# Patient Record
Sex: Male | Born: 1990 | Race: White | Marital: Single | Smoking: Former smoker
Health system: Southern US, Community
[De-identification: ages and names within clinical notes are randomized; demographics above are authoritative.]

## PROBLEM LIST (undated history)

## (undated) DIAGNOSIS — F419 Anxiety disorder, unspecified: Secondary | ICD-10-CM

## (undated) DIAGNOSIS — F32A Depression, unspecified: Secondary | ICD-10-CM

## (undated) DIAGNOSIS — F329 Major depressive disorder, single episode, unspecified: Secondary | ICD-10-CM

---

## 2017-10-29 ENCOUNTER — Other Ambulatory Visit: Payer: Self-pay | Admitting: Otolaryngology

## 2017-10-29 DIAGNOSIS — R590 Localized enlarged lymph nodes: Secondary | ICD-10-CM

## 2017-10-29 DIAGNOSIS — J358 Other chronic diseases of tonsils and adenoids: Secondary | ICD-10-CM

## 2017-11-02 ENCOUNTER — Ambulatory Visit
Admission: RE | Admit: 2017-11-02 | Discharge: 2017-11-02 | Disposition: A | Discharge status: Home / Self Care | Payer: BLUE CROSS/BLUE SHIELD | Source: Ambulatory Visit | Attending: Otolaryngology | Admitting: Otolaryngology

## 2017-11-02 DIAGNOSIS — R590 Localized enlarged lymph nodes: Secondary | ICD-10-CM

## 2017-11-02 DIAGNOSIS — J358 Other chronic diseases of tonsils and adenoids: Secondary | ICD-10-CM

## 2017-11-02 MED ORDER — IOPAMIDOL (ISOVUE-300) INJECTION 61%
75.0000 mL | Freq: Once | INTRAVENOUS | Status: AC | PRN
  Administered 2017-11-02: 75 mL via INTRAVENOUS

## 2017-11-06 ENCOUNTER — Other Ambulatory Visit: Payer: Self-pay | Admitting: Otolaryngology

## 2017-11-06 DIAGNOSIS — R590 Localized enlarged lymph nodes: Secondary | ICD-10-CM

## 2017-11-13 ENCOUNTER — Other Ambulatory Visit (HOSPITAL_COMMUNITY): Payer: Self-pay | Admitting: Otolaryngology

## 2017-11-13 DIAGNOSIS — R590 Localized enlarged lymph nodes: Secondary | ICD-10-CM

## 2018-02-08 ENCOUNTER — Inpatient Hospital Stay (HOSPITAL_COMMUNITY)
Admission: EM | Admit: 2018-02-08 | Discharge: 2018-02-10 | DRG: 373 | Disposition: A | Discharge status: Home / Self Care | Payer: BLUE CROSS/BLUE SHIELD | Attending: Surgery | Admitting: Surgery

## 2018-02-08 ENCOUNTER — Encounter (HOSPITAL_COMMUNITY): Payer: Self-pay | Admitting: *Deleted

## 2018-02-08 ENCOUNTER — Emergency Department (HOSPITAL_COMMUNITY): Payer: BLUE CROSS/BLUE SHIELD

## 2018-02-08 ENCOUNTER — Other Ambulatory Visit: Payer: Self-pay

## 2018-02-08 DIAGNOSIS — F419 Anxiety disorder, unspecified: Secondary | ICD-10-CM | POA: Diagnosis present

## 2018-02-08 DIAGNOSIS — R1031 Right lower quadrant pain: Secondary | ICD-10-CM

## 2018-02-08 DIAGNOSIS — Z79899 Other long term (current) drug therapy: Secondary | ICD-10-CM

## 2018-02-08 DIAGNOSIS — Z87891 Personal history of nicotine dependence: Secondary | ICD-10-CM

## 2018-02-08 DIAGNOSIS — F329 Major depressive disorder, single episode, unspecified: Secondary | ICD-10-CM | POA: Diagnosis present

## 2018-02-08 DIAGNOSIS — K3533 Acute appendicitis with perforation and localized peritonitis, with abscess: Principal | ICD-10-CM | POA: Diagnosis present

## 2018-02-08 DIAGNOSIS — K3580 Unspecified acute appendicitis: Secondary | ICD-10-CM | POA: Diagnosis present

## 2018-02-08 HISTORY — DX: Depression, unspecified: F32.A

## 2018-02-08 HISTORY — DX: Major depressive disorder, single episode, unspecified: F32.9

## 2018-02-08 HISTORY — DX: Anxiety disorder, unspecified: F41.9

## 2018-02-08 LAB — COMPREHENSIVE METABOLIC PANEL
ALBUMIN: 4.5 g/dL (ref 3.5–5.0)
ALT: 36 U/L (ref 17–63)
AST: 26 U/L (ref 15–41)
Alkaline Phosphatase: 63 U/L (ref 38–126)
Anion gap: 12 (ref 5–15)
BILIRUBIN TOTAL: 0.5 mg/dL (ref 0.3–1.2)
BUN: 25 mg/dL — AB (ref 6–20)
CO2: 26 mmol/L (ref 22–32)
CREATININE: 1.22 mg/dL (ref 0.61–1.24)
Calcium: 9.9 mg/dL (ref 8.9–10.3)
Chloride: 99 mmol/L — ABNORMAL LOW (ref 101–111)
GFR calc Af Amer: >60 mL/min (ref >60)
GLUCOSE: 106 mg/dL — AB (ref 65–99)
Potassium: 4.2 mmol/L (ref 3.5–5.1)
Sodium: 137 mmol/L (ref 135–145)
TOTAL PROTEIN: 8 g/dL (ref 6.5–8.1)

## 2018-02-08 LAB — CBC
HEMATOCRIT: 46.7 % (ref 39.0–52.0)
Hemoglobin: 15.7 g/dL (ref 13.0–17.0)
MCH: 29.8 pg (ref 26.0–34.0)
MCHC: 33.6 g/dL (ref 30.0–36.0)
MCV: 88.6 fL (ref 78.0–100.0)
PLATELETS: 288 10*3/uL (ref 150–400)
RBC: 5.27 MIL/uL (ref 4.22–5.81)
RDW: 13.5 % (ref 11.5–15.5)
WBC: 11.6 10*3/uL — AB (ref 4.0–10.5)

## 2018-02-08 LAB — URINALYSIS, ROUTINE W REFLEX MICROSCOPIC
BILIRUBIN URINE: NEGATIVE
GLUCOSE, UA: NEGATIVE mg/dL
Hgb urine dipstick: NEGATIVE
KETONES UR: NEGATIVE mg/dL
LEUKOCYTES UA: NEGATIVE
NITRITE: NEGATIVE
PROTEIN: NEGATIVE mg/dL
Specific Gravity, Urine: 1.017 (ref 1.005–1.030)
pH: 6 (ref 5.0–8.0)

## 2018-02-08 LAB — GENTAMICIN LEVEL, RANDOM: Gentamicin Rm: 2 ug/mL

## 2018-02-08 LAB — LIPASE, BLOOD: Lipase: 26 U/L (ref 11–51)

## 2018-02-08 MED ORDER — IOPAMIDOL (ISOVUE-300) INJECTION 61%
INTRAVENOUS | Status: AC
Start: 2018-02-08 — End: 2018-02-08
  Filled 2018-02-08: qty 100

## 2018-02-08 MED ORDER — GENTAMICIN SULFATE 40 MG/ML IJ SOLN
520.0000 mg | INTRAVENOUS | Status: DC
  Administered 2018-02-08 – 2018-02-10 (×3): 520 mg via INTRAVENOUS
  Filled 2018-02-08 (×4): qty 13

## 2018-02-08 MED ORDER — IOPAMIDOL (ISOVUE-300) INJECTION 61%
100.0000 mL | Freq: Once | INTRAVENOUS | Status: AC | PRN
  Administered 2018-02-08: 100 mL via INTRAVENOUS

## 2018-02-08 MED ORDER — LAMOTRIGINE 100 MG PO TABS
150.0000 mg | ORAL_TABLET | Freq: Every day | ORAL | Status: DC
  Administered 2018-02-10: 150 mg via ORAL
  Filled 2018-02-08 (×2): qty 1

## 2018-02-08 MED ORDER — ACETAMINOPHEN 325 MG PO TABS: 650.0000 mg | ORAL_TABLET | Freq: Four times a day (QID) | ORAL | Status: DC | PRN

## 2018-02-08 MED ORDER — ACETAMINOPHEN 650 MG RE SUPP: 650.0000 mg | Freq: Four times a day (QID) | RECTAL | Status: DC | PRN

## 2018-02-08 MED ORDER — DIPHENHYDRAMINE HCL 25 MG PO CAPS: 25.0000 mg | ORAL_CAPSULE | Freq: Four times a day (QID) | ORAL | Status: DC | PRN

## 2018-02-08 MED ORDER — CIPROFLOXACIN IN D5W 400 MG/200ML IV SOLN
400.0000 mg | Freq: Two times a day (BID) | INTRAVENOUS | Status: DC
  Administered 2018-02-08 – 2018-02-10 (×5): 400 mg via INTRAVENOUS
  Filled 2018-02-08 (×5): qty 200

## 2018-02-08 MED ORDER — METRONIDAZOLE IN NACL 5-0.79 MG/ML-% IV SOLN
500.0000 mg | Freq: Three times a day (TID) | INTRAVENOUS | Status: DC
  Administered 2018-02-08 – 2018-02-10 (×7): 500 mg via INTRAVENOUS
  Filled 2018-02-08 (×7): qty 100

## 2018-02-08 MED ORDER — MORPHINE SULFATE (PF) 2 MG/ML IV SOLN: 2.0000 mg | INTRAVENOUS | Status: DC | PRN

## 2018-02-08 MED ORDER — KCL-LACTATED RINGERS-D5W 20 MEQ/L IV SOLN
INTRAVENOUS | Status: DC
  Administered 2018-02-08 – 2018-02-10 (×5): via INTRAVENOUS
  Filled 2018-02-08 (×7): qty 1000

## 2018-02-08 MED ORDER — DIPHENHYDRAMINE HCL 50 MG/ML IJ SOLN: 25.0000 mg | Freq: Four times a day (QID) | INTRAMUSCULAR | Status: DC | PRN

## 2018-02-08 NOTE — Progress Notes (Signed)
Pharmacy Antibiotic Note  Samuel Ashley is a 27 y.o. male admitted on 02/08/2018 with appendicitis with phlegmon/abscess.  Pharmacy has been consulted for gentamicin dosing, MD dosing cipro/flagyl due to hx PCN allergy.  Plan: Gentamicin 520mg  (~7mg /kg) IV q24h Check 10hr random gentamicin level tonight Follow renal function closely Cipro/Flagyl per MD  Height: 6\' 1"  (185.4 cm) Weight: 180 lb (81.6 kg) IBW/kg (Calculated) : 79.9  Temp (24hrs), Avg:98.2 F (36.8 C), Min:98.2 F (36.8 C), Max:98.2 F (36.8 C)  Recent Labs  Lab 02/08/18 0157  WBC 11.6*  CREATININE 1.22    Estimated Creatinine Clearance: 103.7 mL/min (by C-G formula based on SCr of 1.22 mg/dL).    Allergies  Allergen Reactions  . Bupropion Hives  . Penicillins Rash and Swelling  . Escitalopram Oxalate Other (See Comments)    Memory loss  . Fluticasone Other (See Comments)    epistaxis   . Melatonin Other (See Comments)    nightmares   . Montelukast Other (See Comments)    aggressive   . Sertraline Other (See Comments)    depression     Antimicrobials this admission: 4/22 Gentamicin >> 4/22 Cipro >> 4/22 Flagyl >>  Dose adjustments this admission: 4/22 gentamicin level 10hr post-dose = ___  Microbiology results: None  Thank you for allowing pharmacy to be a part of this patient's care.  Loralee PacasErin Garland Smouse, PharmD, BCPS Pager: [phone] 02/08/2018 10:17 AM

## 2018-02-08 NOTE — ED Notes (Signed)
Patient transported to CT 

## 2018-02-08 NOTE — ED Notes (Signed)
No answer when called to triage.

## 2018-02-08 NOTE — ED Notes (Signed)
ED TO INPATIENT HANDOFF REPORT  Name/Age/Gender Samuel Ashley 27 y.o. male  Code Status    Code Status Orders  (From admission, onward)        Start     Ordered   02/08/18 0911  Full code  Continuous     02/08/18 0914    Code Status History    This patient has a current code status but no historical code status.      Home/SNF/Other Home  Chief Complaint ABD PAIN  Level of Care/Admitting Diagnosis ED Disposition    ED Disposition Condition [facility]: [facility] [100102]  Level of Care: Med-Surg [16]  Diagnosis: Acute appendicitis [149702]  Admitting Physician: CCS, [place]  Attending Physician: CCS, MD [[id number]  PT Class (Do Not Modify): Observation [104]  PT Acc Code (Do Not Modify): Observation [10022]       Medical History Past Medical History:  Diagnosis Date  . Anxiety   . Depression     Allergies Allergies  Allergen Reactions  . Bupropion Hives  . Penicillins Rash and Swelling  . Escitalopram Oxalate Other (See Comments)    Memory loss  . Fluticasone Other (See Comments)    epistaxis   . Melatonin Other (See Comments)    nightmares   . Montelukast Other (See Comments)    aggressive   . Sertraline Other (See Comments)    depression     IV Location/Drains/Wounds Patient Lines/Drains/Airways Status   Active Line/Drains/Airways    Name:   Placement date:   Placement time:   Site:   Days:   Peripheral IV 02/08/18 Left Forearm   02/08/18    0624    Forearm   less than 1          Labs/Imaging Results for orders placed or performed during the hospital encounter of 02/08/18 (from the past 48 hour(s))  Lipase, blood     Status: None   Collection Time: 02/08/18  1:57 AM  Result Value Ref Range   Lipase 26 11 - 51 U/L    Comment: Performed at [facility], [place] [address] [place], [place] [address]  Comprehensive metabolic panel     Status: Abnormal   Collection Time:  02/08/18  1:57 AM  Result Value Ref Range   Sodium 137 135 - 145 mmol/L   Potassium 4.2 3.5 - 5.1 mmol/L   Chloride 99 (L) 101 - 111 mmol/L   CO2 26 22 - 32 mmol/L   Glucose, Bld 106 (H) 65 - 99 mg/dL   BUN 25 (H) 6 - 20 mg/dL   Creatinine, Ser 1.22 0.61 - 1.24 mg/dL   Calcium 9.9 8.9 - 10.3 mg/dL   Total Protein 8.0 6.5 - 8.1 g/dL   Albumin 4.5 3.5 - 5.0 g/dL   AST 26 15 - 41 U/L   ALT 36 17 - 63 U/L   Alkaline Phosphatase 63 38 - 126 U/L   Total Bilirubin 0.5 0.3 - 1.2 mg/dL   GFR calc non Af Amer >60 >60 mL/min   GFR calc Af Amer >60 >60 mL/min    Comment: (NOTE) The eGFR has been calculated using the CKD EPI equation. This calculation has not been validated in all clinical situations. eGFR's persistently <60 mL/min signify possible Chronic Kidney Disease.    Anion gap 12 5 - 15    Comment: Performed at [facility], [place] [address] [place], [place] [address]  CBC  Status: Abnormal   Collection Time: 02/08/18  1:57 AM  Result Value Ref Range   WBC 11.6 (H) 4.0 - 10.5 K/uL   RBC 5.27 4.22 - 5.81 MIL/uL   Hemoglobin 15.7 13.0 - 17.0 g/dL   HCT 46.7 39.0 - 52.0 %   MCV 88.6 78.0 - 100.0 fL   MCH 29.8 26.0 - 34.0 pg   MCHC 33.6 30.0 - 36.0 g/dL   RDW 13.5 11.5 - 15.5 %   Platelets 288 150 - 400 K/uL    Comment: Performed at [facility], [place] [address] [place], [place] [address]  Urinalysis, Routine w reflex microscopic     Status: None   Collection Time: 02/08/18  1:57 AM  Result Value Ref Range   Color, Urine YELLOW YELLOW   APPearance CLEAR CLEAR   Specific Gravity, Urine 1.017 1.005 - 1.030   pH 6.0 5.0 - 8.0   Glucose, UA NEGATIVE NEGATIVE mg/dL   Hgb urine dipstick NEGATIVE NEGATIVE   Bilirubin Urine NEGATIVE NEGATIVE   Ketones, ur NEGATIVE NEGATIVE mg/dL   Protein, ur NEGATIVE NEGATIVE mg/dL   Nitrite NEGATIVE NEGATIVE   Leukocytes, UA NEGATIVE NEGATIVE    Comment: Performed at [facility]  [address], [place], [place] [address]   Ct Abdomen Pelvis W Contrast  Result Date: 02/08/2018 CLINICAL DATA:  27 year old with stabbing right lower quadrant and right groin pain with fever overnight. Possible injury at the gym. EXAM: CT ABDOMEN AND PELVIS WITH CONTRAST TECHNIQUE: Multidetector CT imaging of the abdomen and pelvis was performed using the standard protocol following bolus administration of intravenous contrast. CONTRAST:  145m ISOVUE-300 IOPAMIDOL (ISOVUE-300) INJECTION 61% COMPARISON:  None. FINDINGS: Lower chest: Clear lung bases. No significant pleural or pericardial effusion. Hepatobiliary: The liver is normal in density without focal abnormality. No evidence of gallstones, gallbladder wall thickening or biliary dilatation. Pancreas: Unremarkable. No pancreatic ductal dilatation or surrounding inflammatory changes. Spleen: Normal in size without focal abnormality. Adrenals/Urinary Tract: Both adrenal glands appear normal. The kidneys appear normal without evidence of urinary tract calculus, suspicious lesion or hydronephrosis. No bladder abnormalities are seen. Stomach/Bowel: Bowel assessment limited by the lack enteric contrast. The stomach and small bowel appear unremarkable. There is a retrocecal inflammatory process with a probable phlegmon measuring 3.7 x 3.1 cm on image 51/2. This measures up to 4.0 cm cephalocaudad on coronal image 44. This is likely associated with the appendiceal tip, best seen on the reformatted images. The appendix measures up to 8 mm in diameter. Appearance is suspicious for atypical acute appendicitis with a periappendiceal phlegmon/abscess. There is no cecal wall thickening or significant distention. Prominent stool is present throughout the colon. Vascular/Lymphatic: There are several prominent lymph nodes within the ileocolonic mesentery which are probably reactive. No significant vascular findings. Reproductive: The prostate gland and seminal vesicles appear  normal. Other: There is no free air or ascites. No other extraluminal fluid collections are seen. Musculoskeletal: No acute or significant osseous findings. IMPRESSION: 1. Retrocecal inflammatory process, likely associated with the appendiceal tip, suspicious for atypical appendicitis with a periappendiceal phlegmon/abscess. Appendix: Location: Retrocecal Diameter: 8 mm Appendicolith: No. Mucosal hyper-enhancement: No. Extraluminal gas: No Periappendiceal collection: Ill-defined collection measuring up to 4 cm suspicious for phlegmon/abscess. 2. No evidence of bowel obstruction, free air or ascites. 3. Probable reactive adenopathy in the right lower quadrant. 4. These results were called by telephone at the time of interpretation on 02/08/2018 at 7:37 am to [facility] who verbally acknowledged  these results. Electronically Signed   By: Richardean Sale M.D.   On: 02/08/2018 07:39    Pending Labs Unresulted Labs (From admission, onward)   Start     Ordered   02/09/18 7121  Basic metabolic panel  Tomorrow morning,   R     02/08/18 0914   02/09/18 0500  CBC  Tomorrow morning,   R     02/08/18 0914   02/08/18 0911  HIV antibody (Routine Testing)  Once,   R     02/08/18 0914      Vitals/Pain Today's Vitals   02/08/18 0713 02/08/18 0800 02/08/18 0819 02/08/18 0955  BP: 131/64 (!) 147/71 (!) 147/71 137/70  Pulse: 88 86 86 82  Resp: 16  16 16   Temp:      TempSrc:      SpO2: 99% 96% 98% 99%  Weight:      Height:      PainSc:        Isolation Precautions No active isolations  Medications Medications  iopamidol (ISOVUE-300) 61 % injection (has no administration in time range)  ciprofloxacin (CIPRO) IVPB 400 mg (has no administration in time range)    And  metroNIDAZOLE (FLAGYL) IVPB 500 mg (500 mg Intravenous New Bag/Given 02/08/18 0952)  dextrose 5% in lactated ringers with KCl 20 mEq/L infusion (has no administration in time range)  acetaminophen (TYLENOL) tablet 650 mg (has no  administration in time range)    Or  acetaminophen (TYLENOL) suppository 650 mg (has no administration in time range)  morphine 2 MG/ML injection 2-3 mg (has no administration in time range)  diphenhydrAMINE (BENADRYL) capsule 25 mg (has no administration in time range)    Or  diphenhydrAMINE (BENADRYL) injection 25 mg (has no administration in time range)  lamoTRIgine (LAMICTAL) tablet 150 mg (has no administration in time range)  gentamicin (GARAMYCIN) 520 mg in dextrose 5 % 100 mL IVPB (has no administration in time range)  iopamidol (ISOVUE-300) 61 % injection 100 mL (100 mLs Intravenous Contrast Given 02/08/18 0646)    Mobility walks

## 2018-02-08 NOTE — ED Provider Notes (Signed)
[facility]-EMERGENCY DEPT Provider Note   CSN: [id number] Arrival date & time: 02/08/18  0030     History   Chief Complaint Chief Complaint  Patient presents with  . Abdominal Pain    HPI Samuel Ashley is a 27 y.o. male.  HPI 27 year old male with no pertinent past medical history presents to the ED for evaluation of right lower quadrant abdominal pain.  States that this started 3 days ago after weight lifting.  Patient states the pain is dull and cramping in nature.  Patient states the pain has gradually improved.  Patient states that palpation and range of motion makes the pain worse.  Reports associated fevers and chills.  Patient denies any fevers at this time.  Patient denies any associated nausea, vomiting, diarrhea, urinary symptoms, penile discharge, testicular pain, testicular swelling.  Patient states that the pain is very minimal at this time.  Patient did not take any medications for symptoms prior to arrival.  Nothing makes better.  Pt denies any fever, chill, ha, vision changes, lightheadedness, dizziness, congestion, neck pain, cp, sob, cough, abd pain, n/v/d, urinary symptoms, change in bowel habits, melena, hematochezia, lower extremity paresthesias.  Past Medical History:  Diagnosis Date  . Anxiety   . Depression     There are no active problems to display for this patient.   History reviewed. No pertinent surgical history.      Home Medications    Prior to Admission medications   Not on File    Family History No family history on file.  Social History Social History   Tobacco Use  . Smoking status: Former Smoker  Substance Use Topics  . Alcohol use: Not Currently  . Drug use: Yes    Types: Marijuana    Comment: 1-2 times a week     Allergies   Patient has no allergy information on record.   Review of Systems Review of Systems  All other systems reviewed and are negative.    Physical Exam Updated Vital  Signs BP 131/64   Pulse 88   Temp 98.2 F (36.8 C) (Oral) Comment: last took ibuprofen @ 15:00 yesterday  Resp 16   Ht 6\' 1"  (1.854 m)   Wt 81.6 kg (180 lb)   SpO2 99%   BMI 23.75 kg/m   Physical Exam  Constitutional: He is oriented to person, place, and time. He appears well-developed and well-nourished.  Non-toxic appearance. No distress.  HENT:  Head: Normocephalic and atraumatic.  Mouth/Throat: Oropharynx is clear and moist.  Eyes: Pupils are equal, round, and reactive to light. Conjunctivae are normal. Right eye exhibits no discharge. Left eye exhibits no discharge.  Neck: Normal range of motion. Neck supple.  Cardiovascular: Normal rate, regular rhythm, normal heart sounds and intact distal pulses. Exam reveals no gallop and no friction rub.  No murmur heard. Pulmonary/Chest: Effort normal and breath sounds normal. No stridor. No respiratory distress. He has no wheezes. He has no rales. He exhibits no tenderness.  Abdominal: Soft. Normal appearance and bowel sounds are normal. He exhibits no distension. There is tenderness in the right lower quadrant. There is tenderness at McBurney's point. There is no rigidity, no rebound, no guarding, no CVA tenderness and negative Murphy's sign.  Musculoskeletal: Normal range of motion. He exhibits no tenderness.  Lymphadenopathy:    He has no cervical adenopathy.  Neurological: He is alert and oriented to person, place, and time.  Skin: Skin is warm and dry. Capillary refill takes less  than 2 seconds. No rash noted.  Psychiatric: His behavior is normal. Judgment and thought content normal.  Nursing note and vitals reviewed.    ED Treatments / Results  Labs (all labs ordered are listed, but only abnormal results are displayed) Labs Reviewed  COMPREHENSIVE METABOLIC PANEL - Abnormal; Notable for the following components:      Result Value   Chloride 99 (*)    Glucose, Bld 106 (*)    BUN 25 (*)    All other components within normal  limits  CBC - Abnormal; Notable for the following components:   WBC 11.6 (*)    All other components within normal limits  LIPASE, BLOOD  URINALYSIS, ROUTINE W REFLEX MICROSCOPIC    EKG None  Radiology Ct Abdomen Pelvis W Contrast  Result Date: 02/08/2018 CLINICAL DATA:  27 year old with stabbing right lower quadrant and right groin pain with fever overnight. Possible injury at the gym. EXAM: CT ABDOMEN AND PELVIS WITH CONTRAST TECHNIQUE: Multidetector CT imaging of the abdomen and pelvis was performed using the standard protocol following bolus administration of intravenous contrast. CONTRAST:  100mL ISOVUE-300 IOPAMIDOL (ISOVUE-300) INJECTION 61% COMPARISON:  None. FINDINGS: Lower chest: Clear lung bases. No significant pleural or pericardial effusion. Hepatobiliary: The liver is normal in density without focal abnormality. No evidence of gallstones, gallbladder wall thickening or biliary dilatation. Pancreas: Unremarkable. No pancreatic ductal dilatation or surrounding inflammatory changes. Spleen: Normal in size without focal abnormality. Adrenals/Urinary Tract: Both adrenal glands appear normal. The kidneys appear normal without evidence of urinary tract calculus, suspicious lesion or hydronephrosis. No bladder abnormalities are seen. Stomach/Bowel: Bowel assessment limited by the lack enteric contrast. The stomach and small bowel appear unremarkable. There is a retrocecal inflammatory process with a probable phlegmon measuring 3.7 x 3.1 cm on image 51/2. This measures up to 4.0 cm cephalocaudad on coronal image 44. This is likely associated with the appendiceal tip, best seen on the reformatted images. The appendix measures up to 8 mm in diameter. Appearance is suspicious for atypical acute appendicitis with a periappendiceal phlegmon/abscess. There is no cecal wall thickening or significant distention. Prominent stool is present throughout the colon. Vascular/Lymphatic: There are several  prominent lymph nodes within the ileocolonic mesentery which are probably reactive. No significant vascular findings. Reproductive: The prostate gland and seminal vesicles appear normal. Other: There is no free air or ascites. No other extraluminal fluid collections are seen. Musculoskeletal: No acute or significant osseous findings. IMPRESSION: 1. Retrocecal inflammatory process, likely associated with the appendiceal tip, suspicious for atypical appendicitis with a periappendiceal phlegmon/abscess. Appendix: Location: Retrocecal Diameter: 8 mm Appendicolith: No. Mucosal hyper-enhancement: No. Extraluminal gas: No Periappendiceal collection: Ill-defined collection measuring up to 4 cm suspicious for phlegmon/abscess. 2. No evidence of bowel obstruction, free air or ascites. 3. Probable reactive adenopathy in the right lower quadrant. 4. These results were called by telephone at the time of interpretation on 02/08/2018 at 7:37 am to [facility] Skylinn Vialpando, PA, who verbally acknowledged these results. Electronically Signed   By: Carey BullocksWilliam  Veazey M.D.   On: 02/08/2018 07:39    Procedures Procedures (including critical care time)  Medications Ordered in ED Medications  iopamidol (ISOVUE-300) 61 % injection (has no administration in time range)  iopamidol (ISOVUE-300) 61 % injection 100 mL (100 mLs Intravenous Contrast Given 02/08/18 0646)     Initial Impression / Assessment and Plan / ED Course  I have reviewed the triage vital signs and the nursing notes.  Pertinent labs & imaging results that were available  during my care of the patient were reviewed by me and considered in my medical decision making (see chart for details).     Patient presents the ED with complaints of right lower quadrant abdominal pain.  Reports associated fevers but denies any other associated symptoms.  Patient is nontoxic or septic appearing.  Overall well-appearing.  Vital signs are reassuring.  Patient is afebrile.  On exam  patient does have some mild right lower quadrant tenderness.  There is no rebound.  Negative obturator psoas sign.  Blood work reveals a mild leukocytosis of 12,000.  Otherwise lab work is reassuring.  Normal UA.  CT scan was performed given the location of the pain with elevated white blood cell count and patient subjective fevers at home.  Spoke with radiologist concerning CT findings.  IMPRESSION: 1. Retrocecal inflammatory process, likely associated with the appendiceal tip, suspicious for atypical appendicitis with a periappendiceal phlegmon/abscess. Appendix: Location: Retrocecal Diameter: 8 mm Appendicolith: No. Mucosal hyper-enhancement: No. Extraluminal gas: No Periappendiceal collection: Ill-defined collection measuring up to 4 cm suspicious for phlegmon/abscess. 2. No evidence of bowel obstruction, free air or ascites. 3. Probable reactive adenopathy in the right lower quadrant.  Patient does state that when he was 9 he had "something wrong with his appendix".  Patient never had an appendectomy.  Patient states that he was told that this could flareup eventually.  Spoke with the PA for general surgery who will see patient in the ED.  Awaiting further recommendation and disposition.  Patient signed out to oncoming PA.  Patient updated on plan of care.  Remains hemodynamically stable.  Patient remains n.p.o.    Final Clinical Impressions(s) / ED Diagnoses   Final diagnoses:  RLQ abdominal pain    ED Discharge Orders    None       Wallace Keller 02/08/18 0800    Azalia Bilis, MD 02/09/18 0730

## 2018-02-08 NOTE — ED Triage Notes (Signed)
Pt stated "I've been working out in [employer] and thought I had pulled something but sometimes it is stabbing.  I had a fever earlier tonight.  Pain radiates from RLQ into right groin."  Pt denies n/v/d.

## 2018-02-08 NOTE — Progress Notes (Signed)
Left neck node biopsy 11/11/17   Samuel Gens, MD - 12/23/2017 3:00 PM EST Formatting of this note might be different from the original. Otolaryngology Return Clinic Visit Note  Reason for Visit:  Chief Complaint  Patient presents with  . Follow-up   History of Present Illness 27 y.o. year old male presenting in follow up for swollen left lymph node. Underwent fine needle aspiration in January which was negative for carcinoma. He reports that he is feeling great. Believes his lymphatic swelling has improved. No new complaints since last seen. Specifically, no B or constitutional symptoms.   The patient denies fevers, chills, shortness of breath, chest pain, nausea, vomiting, diarrhea, inability to lie flat, dysphagia, odynophagia, hemoptysis, hematemesis, changes in vision, changes in voice quality, otalgia, otorrhea, vertiginous symptoms, focal deficits, or other concerning symptoms.  Review of SystemsA 12 system review of systems was performed and is negative other than that noted in the history of present illness.  Vital Signs Blood pressure 139/63, pulse 87, temperature 36.9 C (98.4 F), resp. rate 18, height 182.9 cm (6'), weight 83.4 kg (183 lb 14.4 oz), SpO2 98 %. Physical Exam  General: Well-developed, well-nourished. NAD, AAO,  Voice: clear, no stridor  Head/Face: On external examination there is no obvious asymmetry or scars. On palpation there is no tenderness over maxillary sinuses or masses within the salivary glands. Cranial nerves V and VII are intact through all distributions. Eyes: PERRLA, EOMI, the conjunctiva are not injected and sclera is non-icteric. Nose: No external masses, lesions, or deformity. Inspection of nasal mucosa, septum, and turbinates is normal to anterior rhinoscopy.  Oral cavity/oropharynx: The mucosa of the lips, gums, hard and soft palate, posterior pharyngeal wall, tongue, floor of mouth, and buccal region are without masses or lesions  and are normally hydrated. Good dentition. Tongue protrudes midline. Tonsils are symmetric, 1+, without any masses or lesions. Supraglottis not visualized due to gag reflex. Neck: There is no asymmetry or masses. Trachea is midline. There is no enlargement of the thyroid or palpable thyroid nodules.  Lymphatics:  - left neck with stable prominent level 2 LN.  Chest: No audible wheeze, unlabored respirations. Neurologic: Cranial nerve's II-XII are grossly intact. Exam is non-focal. Extremities: No cyanosis, clubbing or edema. Imaging Reviewed. CT scan from 11/02/2017 shows mildly enlarged left greater than right level 2 lymph nodes. No lymph node necrosis or inflammation read is reactive. Otherwise the CT was unremarkable. He does have shotty lymphadenopathy throughout both necks..  In office [place] 11/11/17: Left level 2 node hypoechoic, with clear fatty hilum. It measured 1.5 x 0.9 x 2.5 cm. Very small lymphadenopathy throughout the remainder of the left neck. The right level 2 had a similarly sized and shaped lymph node.  In office [place] 12/23/17: Left node 1.7 x 0.7 x 2.7 cm. Normal appearance. Overall symmetric to right. Lab/Pathology Results: Fine needle aspiration 11/11/17  Lymph node, left neck, fine needle aspiration: - Negative for carcinoma - Lymphoid tissue present  Assessment/Recommendations:The patient is a 27 y.o. male who presents with a likely LAD, biopsy negative for carcinoma.  -[place] in the office today stable from prior. -Patient doing very well clinically, asymptomatic.  -Recommended continued surveillance. -RTC in 6 months for follow up. -They have my contact information and know to contact me with any questions or concerns.    Case Report Non-gynecologic CytologyCase: [id number]  Authorizing Provider:Jeffrey Denyse Amass, Collected: 11/11/2017 1220   MD  Ordering Location: [facility] Received:11/11/2017 [address] [place]  Pathologist: Jerelyn ScottJohann David Hertel, MD Specimen:Lymph Node, Neck, left   [facility]   Final Diagnosis Lymph node, left neck, fine needle aspiration: - Negative for carcinoma - Lymphoid tissue present (see comment)  [facility] Electronically signed by Jerelyn ScottJohann David Hertel, MD on 11/13/2017 at 12:38 PM  Comment The smears and cell block show a mixed population of lymphocytes compatible with lymph node sampling. No carcinoma is identified. In the appropriate clinical setting these findings are consistent with a benign lymph node. However, the presence of a low grade hematolymphoid neoplasm cannot be excluded by cytology and if clinical concern for this exists then additional sampling with material for flow cytometric analysis would be recommended.  [facility]   Clinical History Left neck node  [facility]   Gross Description Lymph node, neck, left, fine needle aspiration  Received in the cytology laboratory are 2 MD ethanol-fixed Paps, 2 MD air-dried Diff-Quick slides; 7 mL cloudy pink fluid, unfixed for cell block  Materials Prepared & Examined:Smear Slides...4 Monolayers...0 Cytospins...0 Cell Blocks....1 Core Biopsy.....0 Touch Prep...0  [facility]   EMBEDDED IMAGES   [facility]   Specimen Adequacy Satisfactory for evaluation  [facility]   Disclaimer Unless otherwise specified, specimens are preserved using 10% neutral  buffered formalin. For cases in which immunohistochemical and/or in-situ hybridization stains are performed, the following statement applies: Appropriate controls for each stain (positive controls with or without negative controls) have been evaluated and stain as expected. These stains have not been separately validated for use on decalcified specimens and should be interpreted with caution in that setting. Some of the reagents used for these stains may be classified as analyte specific reagents (ASR). Tests using ASRs were developed, and their performance characteristics were determined, by the Anatomic Pathology Department [facility]). They have not been cleared or approved by the US Food and Drug Administration (FDA). The FDA does not require these tests to go through premarket FDA review. These tests are used for clinical purposes. They should not be regarded as investigational or for research. This laboratory is certified under the Clinical Laboratory Improvement Amendments (CLIA) as qualified to perform high complexity clinical laboratory testing.  [facility]    Fine Needle Aspiration (11/11/2017 12:20 PM EST)  Specimen  Fine Needle Aspirate - Lymph Node

## 2018-02-08 NOTE — H&P (Addendum)
Samuel Ashley is an 27 y.o. male.   Chief Complaint:  Fever with pain RLQ going into the groin  HPI: 27  Y/o male who was working out and later developed pain RLQ, stabbing pain 02/04/18.  Pain was "rough, in RLQ," initially.  It would come and go.  He has some nausea initially, but this improved.  Pain would come and go, and he thought he just hurt something with weightlifting on Thursday.    He developed some fever Saturday and Sunday, but he did not take his temperature. Pain still persist, but is intermittent. and he finally came to the ED for evaluation.  He reports a similar episode when he was 79 or 27 years old, involving his appendix.  It was not removed and he remembers being told it could reoccur again.    He has recently undergone a cervical lymph node biopsy from [facility] which was negative for any malignancy. Pain is currently much better.  Workup in the ED: He is afebrile slightly tachycardic on admission.  Blood pressure is stable.  CMP shows essentially normal labs glucose was 106, BUN was 25, creatinine 1.22 chloride 99.  WBC is 11.6, hemoglobin is 15.7, hematocrit is 46.7, platelets 288,000.  A CT of the abdomen pelvis was obtained with contrast.  Shows a retrocecal inflammatory process likely associated with appendiceal tip suspicious for atypical appendicitis with a periappendiceal phlegmon/abscess.  There is no appendicolith.  No extraluminal gas.  Ill-defined collection measures approximately 4 cm and was suspicious for phlegmon/abscess.  There is some reactive adenopathy in the right lower quadrant.  We are asked to see.  Past Medical History:  Diagnosis Date  . Anxiety   . Depression     History reviewed. No pertinent surgical history.  No family history on file. Social History:  reports that he has quit smoking. He does not have any smokeless tobacco history on file. He reports that he drank alcohol. He reports that he has current or past drug history. Drug:  Marijuana.  Allergies: Penicillin/amoxicillin/Zoloft/ melatonin/escitalopram  Prior to Admission medications   Medication Sig Start Date End Date Taking? Authorizing Provider  lamoTRIgine (LAMICTAL) 150 MG tablet Take 150 mg by mouth daily. 09/17/17  Yes [provider]  Followed by [facility] in [place]    Results for orders placed or performed during the hospital encounter of 02/08/18 (from the past 48 hour(s))  Lipase, blood     Status: None   Collection Time: 02/08/18  1:57 AM  Result Value Ref Range   Lipase 26 11 - 51 U/L    Comment: Performed at [facility], [place] [address], [place], [place] [address]  Comprehensive metabolic panel     Status: Abnormal   Collection Time: 02/08/18  1:57 AM  Result Value Ref Range   Sodium 137 135 - 145 mmol/L   Potassium 4.2 3.5 - 5.1 mmol/L   Chloride 99 (L) 101 - 111 mmol/L   CO2 26 22 - 32 mmol/L   Glucose, Bld 106 (H) 65 - 99 mg/dL   BUN 25 (H) 6 - 20 mg/dL   Creatinine, Ser 1.22 0.61 - 1.24 mg/dL   Calcium 9.9 8.9 - 10.3 mg/dL   Total Protein 8.0 6.5 - 8.1 g/dL   Albumin 4.5 3.5 - 5.0 g/dL   AST 26 15 - 41 U/L   ALT 36 17 - 63 U/L   Alkaline Phosphatase 63 38 - 126 U/L   Total Bilirubin 0.5 0.3 -  1.2 mg/dL   GFR calc non Af Amer >60 >60 mL/min   GFR calc Af Amer >60 >60 mL/min    Comment: (NOTE) The eGFR has been calculated using the CKD EPI equation. This calculation has not been validated in all clinical situations. eGFR's persistently <60 mL/min signify possible Chronic Kidney Disease.    Anion gap 12 5 - 15    Comment: Performed at [facility], [place] [address] [place], [place] [address]  CBC     Status: Abnormal   Collection Time: 02/08/18  1:57 AM  Result Value Ref Range   WBC 11.6 (H) 4.0 - 10.5 K/uL   RBC 5.27 4.22 - 5.81 MIL/uL   Hemoglobin 15.7 13.0 - 17.0 g/dL   HCT 46.7 39.0 - 52.0 %   MCV 88.6 78.0 - 100.0 fL   MCH 29.8 26.0 - 34.0 pg   MCHC 33.6 30.0 -  36.0 g/dL   RDW 13.5 11.5 - 15.5 %   Platelets 288 150 - 400 K/uL    Comment: Performed at [facility], [place] [address], [place], [place] [address]  Urinalysis, Routine w reflex microscopic     Status: None   Collection Time: 02/08/18  1:57 AM  Result Value Ref Range   Color, Urine YELLOW YELLOW   APPearance CLEAR CLEAR   Specific Gravity, Urine 1.017 1.005 - 1.030   pH 6.0 5.0 - 8.0   Glucose, UA NEGATIVE NEGATIVE mg/dL   Hgb urine dipstick NEGATIVE NEGATIVE   Bilirubin Urine NEGATIVE NEGATIVE   Ketones, ur NEGATIVE NEGATIVE mg/dL   Protein, ur NEGATIVE NEGATIVE mg/dL   Nitrite NEGATIVE NEGATIVE   Leukocytes, UA NEGATIVE NEGATIVE    Comment: Performed at [place] [address], [place], [place] [address]   Ct Abdomen Pelvis W Contrast  Result Date: 02/08/2018 CLINICAL DATA:  27 year old with stabbing right lower quadrant and right groin pain with fever overnight. Possible injury at the gym. EXAM: CT ABDOMEN AND PELVIS WITH CONTRAST TECHNIQUE: Multidetector CT imaging of the abdomen and pelvis was performed using the standard protocol following bolus administration of intravenous contrast. CONTRAST:  133m ISOVUE-300 IOPAMIDOL (ISOVUE-300) INJECTION 61% COMPARISON:  None. FINDINGS: Lower chest: Clear lung bases. No significant pleural or pericardial effusion. Hepatobiliary: The liver is normal in density without focal abnormality. No evidence of gallstones, gallbladder wall thickening or biliary dilatation. Pancreas: Unremarkable. No pancreatic ductal dilatation or surrounding inflammatory changes. Spleen: Normal in size without focal abnormality. Adrenals/Urinary Tract: Both adrenal glands appear normal. The kidneys appear normal without evidence of urinary tract calculus, suspicious lesion or hydronephrosis. No bladder abnormalities are seen. Stomach/Bowel: Bowel assessment limited by the lack enteric contrast. The stomach and small bowel appear  unremarkable. There is a retrocecal inflammatory process with a probable phlegmon measuring 3.7 x 3.1 cm on image 51/2. This measures up to 4.0 cm cephalocaudad on coronal image 44. This is likely associated with the appendiceal tip, best seen on the reformatted images. The appendix measures up to 8 mm in diameter. Appearance is suspicious for atypical acute appendicitis with a periappendiceal phlegmon/abscess. There is no cecal wall thickening or significant distention. Prominent stool is present throughout the colon. Vascular/Lymphatic: There are several prominent lymph nodes within the ileocolonic mesentery which are probably reactive. No significant vascular findings. Reproductive: The prostate gland and seminal vesicles appear normal. Other: There is no free air or ascites. No other extraluminal fluid collections are seen. Musculoskeletal: No acute or significant osseous findings. IMPRESSION:  1. Retrocecal inflammatory process, likely associated with the appendiceal tip, suspicious for atypical appendicitis with a periappendiceal phlegmon/abscess. Appendix: Location: Retrocecal Diameter: 8 mm Appendicolith: No. Mucosal hyper-enhancement: No. Extraluminal gas: No Periappendiceal collection: Ill-defined collection measuring up to 4 cm suspicious for phlegmon/abscess. 2. No evidence of bowel obstruction, free air or ascites. 3. Probable reactive adenopathy in the right lower quadrant. 4. These results were called by telephone at the time of interpretation on 02/08/2018 at 7:37 am to [facility] [place], who verbally acknowledged these results. Electronically Signed   By: Richardean Sale M.D.   On: 02/08/2018 07:39    Review of Systems  Constitutional: Positive for fever. Negative for chills, diaphoresis, malaise/fatigue and weight loss.  HENT: Negative.   Eyes: Negative.   Respiratory: Negative.   Cardiovascular: Negative.   Gastrointestinal: Positive for abdominal pain (pain RLQ) and nausea (just with  initial pain). Negative for blood in stool, constipation, diarrhea, heartburn, melena and vomiting.  Genitourinary: Negative.   Musculoskeletal: Negative.   Skin: Negative.   Neurological: Negative.   Endo/Heme/Allergies: Negative.   Psychiatric/Behavioral: Positive for depression. The patient is nervous/anxious.        On Lamictal for some time.    Blood pressure 131/64, pulse 88, temperature 98.2 F (36.8 C), temperature source Oral, resp. rate 16, height 6' 1"  (1.854 m), weight 81.6 kg (180 lb), SpO2 99 %. Physical Exam  Constitutional: He is oriented to person, place, and time. He appears well-developed and well-nourished. No distress.  HENT:  Head: Normocephalic and atraumatic.  Mouth/Throat: Oropharynx is clear and moist. No oropharyngeal exudate.  Eyes: Right eye exhibits no discharge. Left eye exhibits no discharge. No scleral icterus.  Pupils are equal  Neck: Normal range of motion. Neck supple. No JVD present. No tracheal deviation present. No thyromegaly present.  Cardiovascular: Normal rate, regular rhythm, normal heart sounds and intact distal pulses.  No murmur heard. Respiratory: Effort normal and breath sounds normal. No respiratory distress. He has no wheezes. He has no rales. He exhibits no tenderness.  GI: Soft. Bowel sounds are normal. He exhibits no distension and no mass. There is tenderness (minimal tenderness RUQ). There is no rebound and no guarding.  Musculoskeletal: He exhibits no edema or tenderness.  Lymphadenopathy:    He has no cervical adenopathy (he has some mandibular node he can feel, I was not impressed).  Neurological: He is alert and oriented to person, place, and time. No cranial nerve deficit.  Skin: Skin is warm and dry. No rash noted. He is not diaphoretic. No erythema. No pallor.  Psychiatric: His behavior is normal. Judgment and thought content normal.     Assessment/Plan Appendicitis with phlegmon/abscess History of  anxiety/depression  Plan: We will plan to admit and place on IV antibiotics.  I will make him n.p.o. for now and await Dr. Earlie Server review of the CT scan.  To make a final determination over medical management with interval appendectomy, versus appendectomy at this hospitalization.  Yaniris Braddock, PA-C 02/08/2018, 7:51 AM

## 2018-02-09 DIAGNOSIS — K3533 Acute appendicitis with perforation and localized peritonitis, with abscess: Secondary | ICD-10-CM | POA: Diagnosis present

## 2018-02-09 DIAGNOSIS — Z79899 Other long term (current) drug therapy: Secondary | ICD-10-CM | POA: Diagnosis not present

## 2018-02-09 DIAGNOSIS — Z87891 Personal history of nicotine dependence: Secondary | ICD-10-CM | POA: Diagnosis not present

## 2018-02-09 DIAGNOSIS — F329 Major depressive disorder, single episode, unspecified: Secondary | ICD-10-CM | POA: Diagnosis present

## 2018-02-09 DIAGNOSIS — F419 Anxiety disorder, unspecified: Secondary | ICD-10-CM | POA: Diagnosis present

## 2018-02-09 LAB — BASIC METABOLIC PANEL
Anion gap: 10 (ref 5–15)
BUN: 14 mg/dL (ref 6–20)
CALCIUM: 9.9 mg/dL (ref 8.9–10.3)
CO2: 29 mmol/L (ref 22–32)
Chloride: 103 mmol/L (ref 101–111)
Creatinine, Ser: 1.26 mg/dL — ABNORMAL HIGH (ref 0.61–1.24)
GFR calc Af Amer: >60 mL/min (ref >60)
GLUCOSE: 113 mg/dL — AB (ref 65–99)
Potassium: 4.3 mmol/L (ref 3.5–5.1)
Sodium: 142 mmol/L (ref 135–145)

## 2018-02-09 LAB — CBC
HEMATOCRIT: 43.5 % (ref 39.0–52.0)
Hemoglobin: 14.6 g/dL (ref 13.0–17.0)
MCH: 29.9 pg (ref 26.0–34.0)
MCHC: 33.6 g/dL (ref 30.0–36.0)
MCV: 89.1 fL (ref 78.0–100.0)
Platelets: 242 10*3/uL (ref 150–400)
RBC: 4.88 MIL/uL (ref 4.22–5.81)
RDW: 13.6 % (ref 11.5–15.5)
WBC: 7.5 10*3/uL (ref 4.0–10.5)

## 2018-02-09 LAB — HIV ANTIBODY (ROUTINE TESTING W REFLEX): HIV Screen 4th Generation wRfx: NONREACTIVE

## 2018-02-09 MED ORDER — SODIUM CHLORIDE 0.9 % IV BOLUS
1000.0000 mL | Freq: Once | INTRAVENOUS | Status: AC
  Administered 2018-02-09: 1000 mL via INTRAVENOUS

## 2018-02-09 NOTE — Progress Notes (Signed)
Pharmacy Antibiotic Note  Samuel Ashley is a 27 y.o. male admitted on 02/08/2018 with appendicitis with phlegmon/abscess.  Pharmacy has been consulted for gentamicin dosing, MD dosing cipro/flagyl due to hx PCN allergy.  10hr random gentamicin level 2.0 - within range for 24 hr extended interval dosing per [place] [facility]  Plan: Continue Gentamicin 520mg  (~7mg /kg) IV q24h Follow renal function closely Cipro/Flagyl per MD  Height: 6\' 1"  (185.4 cm) Weight: 180 lb (81.6 kg) IBW/kg (Calculated) : 79.9  Temp (24hrs), Avg:98.5 F (36.9 C), Min:98.2 F (36.8 C), Max:98.7 F (37.1 C)  Recent Labs  Lab 02/08/18 0157 02/08/18 2213  WBC 11.6*  --   CREATININE 1.22  --   GENTRANDOM  --  2.0    Estimated Creatinine Clearance: 103.7 mL/min (by C-G formula based on SCr of 1.22 mg/dL).    Allergies  Allergen Reactions  . Bupropion Hives  . Penicillins Rash and Swelling  . Escitalopram Oxalate Other (See Comments)    Memory loss  . Fluticasone Other (See Comments)    epistaxis   . Melatonin Other (See Comments)    nightmares   . Montelukast Other (See Comments)    aggressive   . Sertraline Other (See Comments)    depression     Antimicrobials this admission: 4/22 Gentamicin >> 4/22 Cipro >> 4/22 Flagyl >>  Dose adjustments this admission: 4/22 gentamicin level 10hr post-dose = _2__  Microbiology results: None  Thank you for allowing pharmacy to be a part of this patient's care. Herby AbrahamMichelle T. Kristen Bushway, Pharm.D. [phone] 02/09/2018 1:00 AM

## 2018-02-09 NOTE — Progress Notes (Signed)
    CC: RLQ pain  Subjective: He looks good and has no pain.  Feels good too.  Objective: Vital signs in last 24 hours: Temp:  [97.9 F (36.6 C)-98.7 F (37.1 C)] 97.9 F (36.6 C) (04/23 0535) Pulse Rate:  [43-82] 43 (04/23 0535) Resp:  [14-16] 16 (04/23 0535) BP: (113-137)/(55-70) 125/63 (04/23 0535) SpO2:  [96 %-99 %] 98 % (04/23 0535) Weight:  [87.7 kg (193 lb 6.4 oz)] 87.7 kg (193 lb 6.4 oz) (04/23 0600) Last BM Date: 02/09/18 1200 IV  Nothing p.o. Recorded Voided x2 Afebrile vital signs are stable Creatinine 1.26 WBC is normal, 7.5   intake/Output from previous day: 04/22 0701 - 04/23 0700 In: 1269.3 [I.V.:356.3; IV Piggyback:913] Out: -  Intake/Output this shift: No intake/output data recorded.  General appearance: alert, cooperative and no distress Resp: clear to auscultation bilaterally GI: soft, non-tender; bowel sounds normal; no masses,  no organomegaly  Lab Results:  Recent Labs    02/08/18 0157 02/09/18 0620  WBC 11.6* 7.5  HGB 15.7 14.6  HCT 46.7 43.5  PLT 288 242    BMET Recent Labs    02/08/18 0157 02/09/18 0620  NA 137 142  K 4.2 4.3  CL 99* 103  CO2 26 29  GLUCOSE 106* 113*  BUN 25* 14  CREATININE 1.22 1.26*  CALCIUM 9.9 9.9   PT/INR No results for input(s): LABPROT, INR in the last 72 hours.  Recent Labs  Lab 02/08/18 0157  AST 26  ALT 36  ALKPHOS 63  BILITOT 0.5  PROT 8.0  ALBUMIN 4.5     Lipase     Component Value Date/Time   LIPASE 26 02/08/2018 0157     Medications: . lamoTRIgine  150 mg Oral Daily   . ciprofloxacin Stopped (02/08/18 2201)   And  . metronidazole Stopped (02/09/18 0150)  . dextrose 5% lactated ringers with KCl 20 mEq/L 125 mL/hr at 02/08/18 2104  . gentamicin Stopped (02/08/18 1331)   Assessment/Plan History of anxiety/depression  Appendicitis with phlegmon/abscess  - medical management/interval appendectomy  FEN: IV fluids/clear liquids  DVT:  SCDs/Lovenox ZO:XWRUE/AVWUJW/[id number]:Cipro/Flagyl/gentamicin protocol - 4/22 =>> day 2 Follow-up: Dr. Luretha MurphyMatthew Martin   Plan:  Full liquids, continue antibiotics, discuss advancing to PO antibiotics tomorrow and length of abx treatment.  Increase IV fluids recheck labs in AM.         LOS: 0 days    Azrielle Springsteen 02/09/2018 [phone]

## 2018-02-10 ENCOUNTER — Encounter (HOSPITAL_COMMUNITY): Payer: Self-pay | Admitting: Radiology

## 2018-02-10 ENCOUNTER — Inpatient Hospital Stay (HOSPITAL_COMMUNITY): Payer: BLUE CROSS/BLUE SHIELD

## 2018-02-10 LAB — BASIC METABOLIC PANEL
ANION GAP: 10 (ref 5–15)
BUN: 12 mg/dL (ref 6–20)
CHLORIDE: 102 mmol/L (ref 101–111)
CO2: 28 mmol/L (ref 22–32)
Calcium: 9.6 mg/dL (ref 8.9–10.3)
Creatinine, Ser: 1.11 mg/dL (ref 0.61–1.24)
GFR calc non Af Amer: >60 mL/min (ref >60)
Glucose, Bld: 100 mg/dL — ABNORMAL HIGH (ref 65–99)
POTASSIUM: 4.2 mmol/L (ref 3.5–5.1)
Sodium: 140 mmol/L (ref 135–145)

## 2018-02-10 LAB — CBC
HEMATOCRIT: 43.2 % (ref 39.0–52.0)
Hemoglobin: 14.5 g/dL (ref 13.0–17.0)
MCH: 29.7 pg (ref 26.0–34.0)
MCHC: 33.6 g/dL (ref 30.0–36.0)
MCV: 88.3 fL (ref 78.0–100.0)
Platelets: 236 10*3/uL (ref 150–400)
RBC: 4.89 MIL/uL (ref 4.22–5.81)
RDW: 13.3 % (ref 11.5–15.5)
WBC: 6.4 10*3/uL (ref 4.0–10.5)

## 2018-02-10 MED ORDER — SACCHAROMYCES BOULARDII 250 MG PO CAPS
250.0000 mg | ORAL_CAPSULE | Freq: Two times a day (BID) | ORAL | Status: DC
  Administered 2018-02-10: 250 mg via ORAL
  Filled 2018-02-10: qty 1

## 2018-02-10 MED ORDER — HEPARIN SODIUM (PORCINE) 5000 UNIT/ML IJ SOLN
5000.0000 [IU] | Freq: Three times a day (TID) | INTRAMUSCULAR | Status: DC
  Administered 2018-02-10: 5000 [IU] via SUBCUTANEOUS
  Filled 2018-02-10: qty 1

## 2018-02-10 MED ORDER — IOPAMIDOL (ISOVUE-300) INJECTION 61%
INTRAVENOUS | Status: AC
  Administered 2018-02-10: 15 mL via ORAL
  Filled 2018-02-10: qty 30

## 2018-02-10 MED ORDER — SACCHAROMYCES BOULARDII 250 MG PO CAPS: ORAL_CAPSULE | ORAL | Status: AC

## 2018-02-10 MED ORDER — IOPAMIDOL (ISOVUE-300) INJECTION 61%
15.0000 mL | Freq: Once | INTRAVENOUS | Status: DC | PRN
  Administered 2018-02-10: 15 mL via ORAL
  Filled 2018-02-10: qty 30

## 2018-02-10 MED ORDER — CIPROFLOXACIN HCL 500 MG PO TABS: 500.0000 mg | ORAL_TABLET | Freq: Two times a day (BID) | ORAL | Status: DC

## 2018-02-10 MED ORDER — IOHEXOL 300 MG/ML  SOLN
100.0000 mL | Freq: Once | INTRAMUSCULAR | Status: AC | PRN
  Administered 2018-02-10: 100 mL via INTRAVENOUS

## 2018-02-10 MED ORDER — METRONIDAZOLE 500 MG PO TABS: 500.0000 mg | ORAL_TABLET | Freq: Three times a day (TID) | ORAL | 0 refills | Status: AC

## 2018-02-10 MED ORDER — METRONIDAZOLE 500 MG PO TABS
500.0000 mg | ORAL_TABLET | Freq: Three times a day (TID) | ORAL | Status: DC
  Administered 2018-02-10: 500 mg via ORAL
  Filled 2018-02-10: qty 1

## 2018-02-10 MED ORDER — CIPROFLOXACIN HCL 500 MG PO TABS: 500.0000 mg | ORAL_TABLET | Freq: Two times a day (BID) | ORAL | 0 refills | Status: AC

## 2018-02-10 MED ORDER — ACETAMINOPHEN 325 MG PO TABS: 650.0000 mg | ORAL_TABLET | Freq: Four times a day (QID) | ORAL | Status: AC | PRN

## 2018-02-10 NOTE — Discharge Instructions (Signed)
Appendicitis  You most likely have appendicitis with a small perforation, with possible abscess. If you have fever, increased pain, nausea, trouble voiding, you just feel bad and not sure why:  Call our office and get seen as soon as possible.  If you are seriously ill go back to the Emergency department where we can do labs and repeat your CT scan.     The appendix is a finger-shaped tube that is attached to the large intestine. Appendicitis is inflammation of the appendix. Without treatment, appendicitis can cause the appendix to tear (rupture). A ruptured appendix can lead to a life-threatening infection. It can also lead to the formation of a painful collection of pus (abscess) in the appendix. What are the causes? This condition may be caused by a blockage in the appendix that leads to infection. The blockage can be due to:  A ball of stool.  Enlarged lymph glands.  In some cases, the cause may not be known. What increases the risk? This condition is more likely to develop in people who are 9010-27 years of age. What are the signs or symptoms? Symptoms of this condition include:  Pain around the belly button that moves toward the lower right abdomen. The pain can become more severe as time passes. It gets worse with coughing or sudden movements.  Tenderness in the lower right abdomen.  Nausea.  Vomiting.  Loss of appetite.  Fever.  Constipation.  Diarrhea.  Generally not feeling well.  How is this diagnosed? This condition may be diagnosed with:  A physical exam.  Blood tests.  Urine test.  To confirm the diagnosis, an ultrasound, MRI, or CT scan may be done. How is this treated? This condition is usually treated by taking out the appendix (appendectomy). There are two methods for doing an appendectomy:  Open appendectomy. In this surgery, the appendix is removed through a large cut (incision) that is made in the lower right abdomen. This procedure may be  recommended if: ? You have major scarring from a previous surgery. ? You have a bleeding disorder. ? You are pregnant and are near term. ? You have a condition that makes the laparoscopic procedure impossible, such as an advanced infection or a ruptured appendix.  Laparoscopic appendectomy. In this surgery, the appendix is removed through small incisions. This procedure usually causes less pain and fewer problems than an open appendectomy. It also has a shorter recovery time.  If the appendix has ruptured and an abscess has formed, a drain may be placed into the abscess to remove fluid and antibiotic medicines may be given through an IV tube. The appendix may or may not need to be removed. This information is not intended to replace advice given to you by your health care provider. Make sure you discuss any questions you have with your health care provider. Document Released: 10/06/2005 Document Revised: 02/13/2016 Document Reviewed: 02/21/2015 Elsevier Interactive Patient Education  2017 [employer]

## 2018-02-10 NOTE — Discharge Summary (Signed)
Physician Discharge Summary  Patient ID: Samuel Ashley MRN: [id number] DOB/AGE: Sep 18, 1991 27 y.o.  Admit date: 02/08/2018 Discharge date: 02/10/2018  Admission Diagnoses:  Appendicitis with phlegmon/abscess History of anxiety/depression     Discharge Diagnoses:  Appendicitis with phlegmon/abscess History of anxiety/depression     Active Problems:   Acute appendicitis   PROCEDURES:   Hospital Course: 27  Y/o male who was working out and later developed pain RLQ, stabbing pain 02/04/18.  Pain was "rough, in RLQ," initially.  It would come and go.  He has some nausea initially, but this improved.  Pain would come and go, and he thought he just hurt something with weightlifting on Thursday.    He developed some fever Saturday and Sunday, but he did not take his temperature. Pain still persist, but is intermittent. and he finally came to the ED for evaluation.  He reports a similar episode when he was 27 or 27 years old, involving his appendix.  It was not removed and he remembers being told it could reoccur again.    He has recently undergone a cervical lymph node biopsy from [facility] which was negative for any malignancy. Pain is currently much better.  Workup in the ED: He is afebrile slightly tachycardic on admission.  Blood pressure is stable.  CMP shows essentially normal labs glucose was 106, BUN was 25, creatinine 1.22 chloride 99.  WBC is 11.6, hemoglobin is 15.7, hematocrit is 46.7, platelets 288,000.  A CT of the abdomen pelvis was obtained with contrast.  Shows a retrocecal inflammatory process likely associated with appendiceal tip suspicious for atypical appendicitis with a periappendiceal phlegmon/abscess.  There is no appendicolith.  No extraluminal gas.  Ill-defined collection measures approximately 4 cm and was suspicious for phlegmon/abscess.  There is some reactive adenopathy in the right lower quadrant.   We reviewed the CT and agreed with findings and pt was  admitted and placed on IV antibiotics.    Symptoms improved as did his WBC.  A repeat CT was obtained on 02/10/18 and showed no worsening of his phlegmon or possible abscess.  His WBC was normal as was his physical exam.  It was Dr. Ermalene Searing opinion he could go home on a total of 2 weeks of Cipro and Flagyl.  He will follow up in the office in 2 weeks in [place].  He will decide on repeat CT scan at that time and plans for interval appendectomy.  He know to call if he has any reoccurrence of his symptoms.   CBC Latest Ref Rng & Units 02/10/2018 02/09/2018 02/08/2018  WBC 4.0 - 10.5 K/uL 6.4 7.5 11.6(H)  Hemoglobin 13.0 - 17.0 g/dL 82.9 56.2 13.0  Hematocrit 39.0 - 52.0 % 43.2 43.5 46.7  Platelets 150 - 400 K/uL 236 242 288   CMP Latest Ref Rng & Units 02/10/2018 02/09/2018 02/08/2018  Glucose 65 - 99 mg/dL 865(H) 846(N) 629(B)  BUN 6 - 20 mg/dL 12 14 28(U)  Creatinine 0.61 - 1.24 mg/dL 1.32 4.40(N) 0.27  Sodium 135 - 145 mmol/L 140 142 137  Potassium 3.5 - 5.1 mmol/L 4.2 4.3 4.2  Chloride 101 - 111 mmol/L 102 103 99(L)  CO2 22 - 32 mmol/L 28 29 26   Calcium 8.9 - 10.3 mg/dL 9.6 9.9 9.9  Total Protein 6.5 - 8.1 g/dL - - 8.0  Total Bilirubin 0.3 - 1.2 mg/dL - - 0.5  Alkaline Phos 38 - 126 U/L - - 63  AST 15 - 41 U/L - - 26  ALT 17 - 63 U/L - - 36    Condition on DC:  Improved   Disposition: Discharge disposition: 01-Home or Self Care        Allergies as of 02/10/2018      Reactions   Bupropion Hives   Penicillins Rash, Swelling   Escitalopram Oxalate Other (See Comments)   Memory loss   Fluticasone Other (See Comments)   epistaxis   Melatonin Other (See Comments)   nightmares   Montelukast Other (See Comments)   aggressive   Sertraline Other (See Comments)   depression      Medication List    TAKE these medications   acetaminophen 325 MG tablet Commonly known as:  TYLENOL Take 2 tablets (650 mg total) by mouth every 6 (six) hours as needed for mild pain (or temp >  100).   ciprofloxacin 500 MG tablet Commonly known as:  CIPRO Take 1 tablet (500 mg total) by mouth 2 (two) times daily for 11 days.   clonazePAM 0.5 MG tablet Commonly known as:  KLONOPIN Take 0.5 mg by mouth 3 (three) times daily.   etodolac 400 MG tablet Commonly known as:  LODINE Take 400 mg by mouth 2 (two) times daily as needed for mild pain.   lamoTRIgine 150 MG tablet Commonly known as:  LAMICTAL Take 150 mg by mouth daily.   metroNIDAZOLE 500 MG tablet Commonly known as:  FLAGYL Take 1 tablet (500 mg total) by mouth every 8 (eight) hours for 11 days.   saccharomyces boulardii 250 MG capsule Commonly known as:  FLORASTOR You can buy this over the counter at any drug store.  You can use what you have at home.  Follow package instructions for your type of probiotic.  I would use this for at least the next month.   THEREMS Tabs Take 1 tablet by mouth daily.      Follow-up Information    Shroff, Clancy Gourdushad Darius, MD Follow up.   Specialty:  Internal Medicine Why:  follow up for other medical issues as needed.   Contact information: [address] [facility] [place] [address] [phone]        [facility] Follow up on 02/25/2018.   Specialty:  General Surgery Why:  Appointment with Dr. Luretha MurphyMatthew Martin: Your appointment is at 9:15 AM, be at the office 30 minutes early for check in.  Bring photo ID and insurance information. Contact information: [address] [place] [address] [phone]        Luretha MurphyMartin, Matthew, MD Follow up.   Specialty:  General Surgery Why:  [facility], and this is so you can get ahold of us here in town.  Your follow [place] in [place] and is listed below. Contact information: [address] [place] [address] [phone]           Signed: Sherrie GeorgeJENNINGS,Tamar Lipscomb 02/10/2018, 2:35 PM

## 2018-02-10 NOTE — Progress Notes (Signed)
Patient has discharged to home on 02/10/18. Discharge instruction including medication and appointment was given to patient. Patient has no question at this time.

## 2018-02-10 NOTE — Progress Notes (Addendum)
    CC:  Abdominal pain  Subjective: He looks and feels great.  He is going down for CT later and has finished on bottle of contrast so far.  Objective: Vital signs in last 24 hours: Temp:  [97.9 F (36.6 C)-98.5 F (36.9 C)] 98.5 F (36.9 C) (04/24 0538) Pulse Rate:  [52-61] 54 (04/24 0538) Resp:  [16-18] 18 (04/24 0538) BP: (107-130)/(61-66) 107/62 (04/24 0538) SpO2:  [97 %-98 %] 97 % (04/24 0538) Last BM Date: 02/09/18 1022 IV PO not recorded Afebrile, VSS WBC remains normal  BMP OK CT pending   Intake/Output from previous day: 04/23 0701 - 04/24 0700 In: 2648.4 [I.V.:1022.9; IV Piggyback:1625.5] Out: -  Intake/Output this shift: No intake/output data recorded.  General appearance: alert, cooperative and no distress Resp: clear to auscultation bilaterally GI: soft, non-tender; bowel sounds normal; no masses,  no organomegaly  Lab Results:  Recent Labs    02/09/18 0620 02/10/18 0621  WBC 7.5 6.4  HGB 14.6 14.5  HCT 43.5 43.2  PLT 242 236    BMET Recent Labs    02/09/18 0620 02/10/18 0621  NA 142 140  K 4.3 4.2  CL 103 102  CO2 29 28  GLUCOSE 113* 100*  BUN 14 12  CREATININE 1.26* 1.11  CALCIUM 9.9 9.6   PT/INR No results for input(s): LABPROT, INR in the last 72 hours.  Recent Labs  Lab 02/08/18 0157  AST 26  ALT 36  ALKPHOS 63  BILITOT 0.5  PROT 8.0  ALBUMIN 4.5     Lipase     Component Value Date/Time   LIPASE 26 02/08/2018 0157     Medications: . lamoTRIgine  150 mg Oral Daily    Assessment/Plan History of anxiety/depression  Appendicitis withphlegmon/abscess  - medical management/interval appendectomy  FEN: IV fluids/full liquids  DVT: SCDs/Heparin ZO:XWRUE/AVWUJW/JXBJYNWGNF:Cipro/Flagyl/gentamicin protocol - 4/22 =>> day 3 Follow-up: Dr. Luretha MurphyMatthew Martin  Plan:  CT today, add probiotic which he takes at home and hopefully home on PO antibiotics later today.  CT reviewed with Dr. Daphine DeutscherMartin.   There is continued presence of  retrocecal inflammatory abnormality measuring approximately 3.6 cm associated with the tail of the appendix, most consistent with phlegmon secondary to appendicitis. The more proximal portion of the appendix is unremarkable. IMP: Stable, probable periappendiceal phlegmon or abscess is seen in right lower quadrant compared to prior exam.  Plan:  Home on 14 days total antibiotics and follow up with Dr. Daphine DeutscherMartin.      LOS: 1 day    Jamarie Mussa 02/10/2018 [phone]

## 2019-03-02 IMAGING — CT CT ABD-PELV W/ CM
2 of 4 series · 16 of 46 positions shown, 18 images · IV contrast (APPLIED)
Comparison: CT scan of February 08, 2018.

CLINICAL DATA: Acute generalized abdominal pain.

EXAM:
CT ABDOMEN AND PELVIS WITH CONTRAST
TECHNIQUE: Multidetector CT imaging of the abdomen and pelvis was performed
using the standard protocol following bolus administration of
intravenous contrast.
CONTRAST:  100mL OMNIPAQUE IOHEXOL 300 MG/ML  SOLN

[Series 2: axial st · axial · 0.72mm/px · z∈[-502,-92]mm · 13 of 92 slices shown, 15 images]
[im 5/92  soft-tissue]
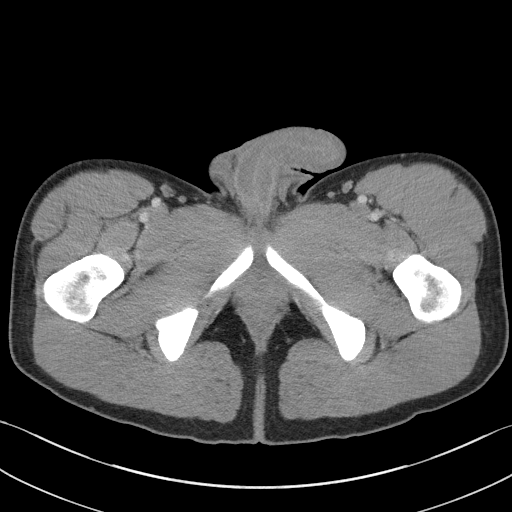
[im 5/92  bone]
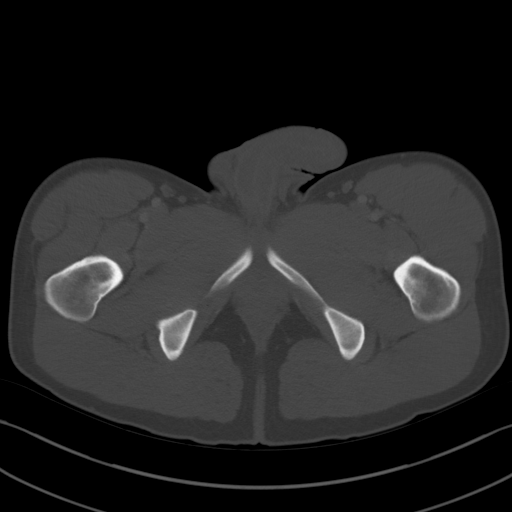
[im 14/92  soft-tissue]
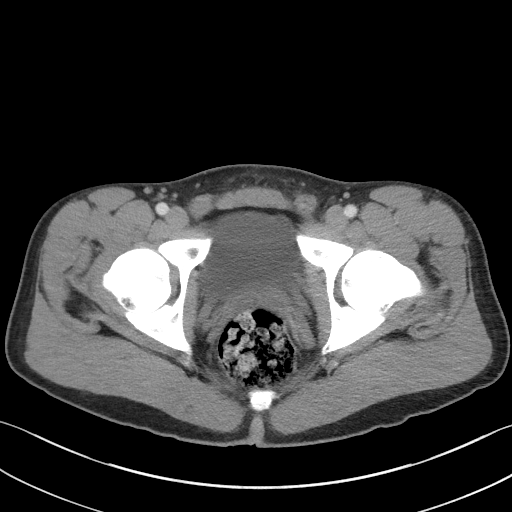
[im 19/92  soft-tissue]
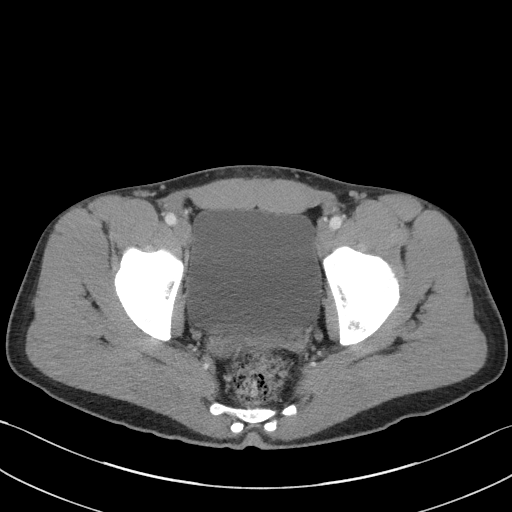
[im 28/92  soft-tissue]
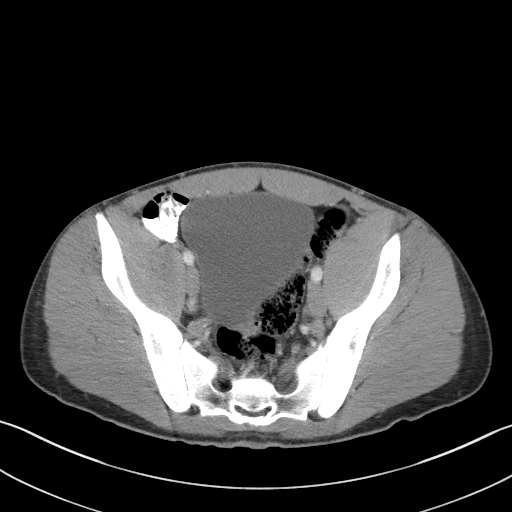
[im 32/92  soft-tissue]
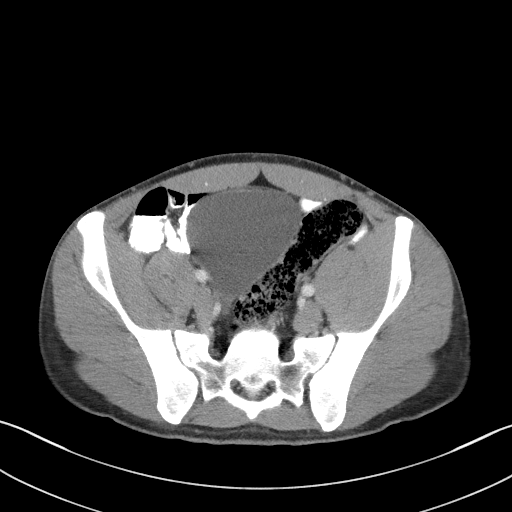
[im 41/92  soft-tissue]
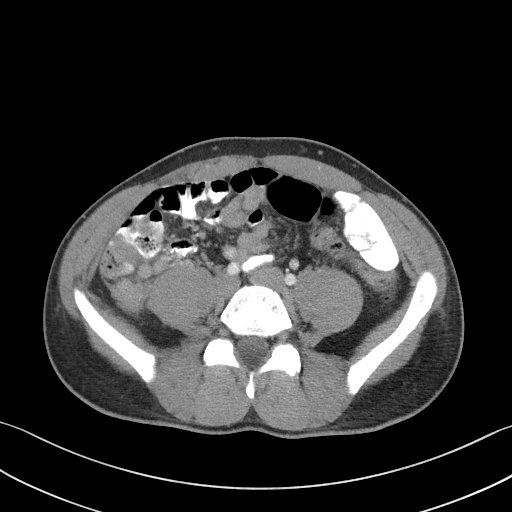
[im 46/92  soft-tissue]
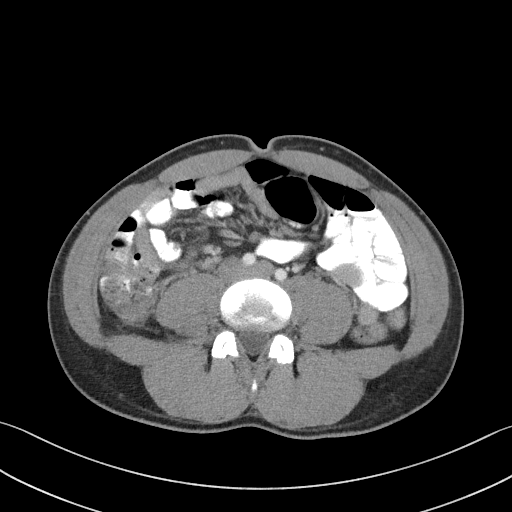
[im 51/92  soft-tissue]
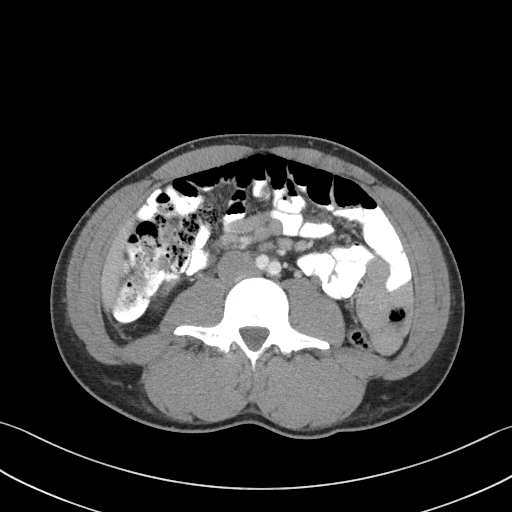
[im 60/92  soft-tissue]
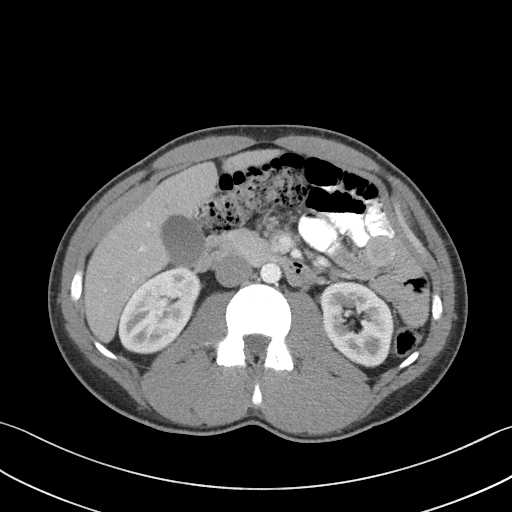
[im 60/92  bone]
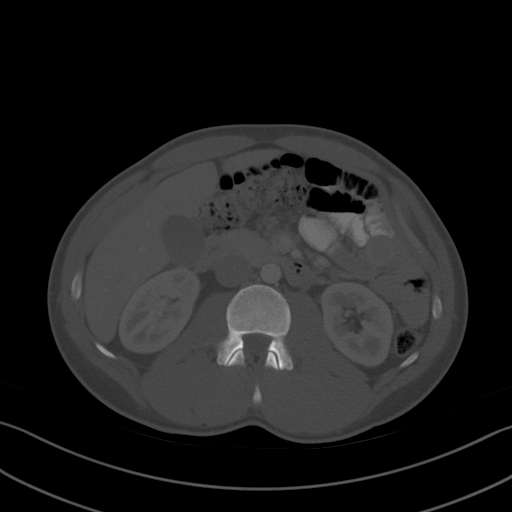
[im 64/92  soft-tissue]
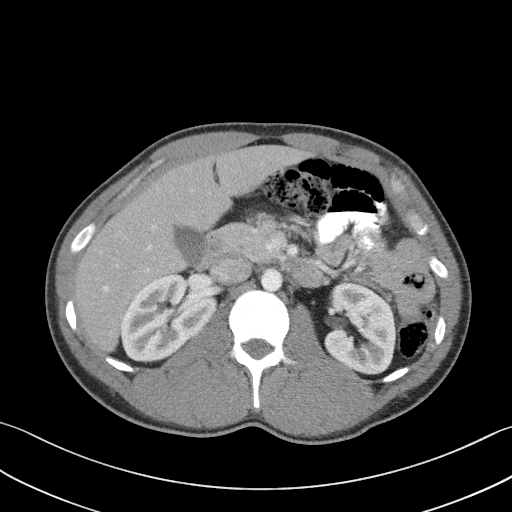
[im 73/92  soft-tissue]
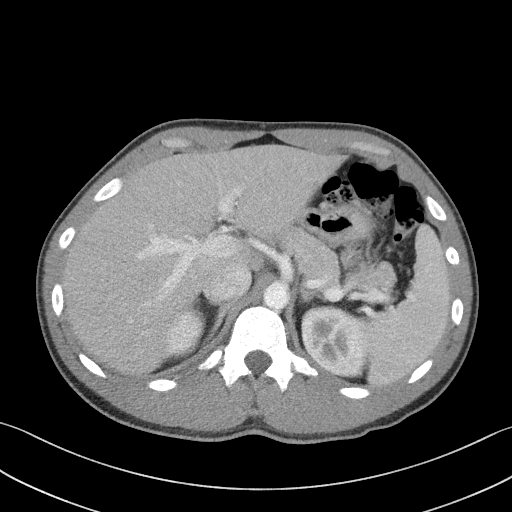
[im 78/92  soft-tissue]
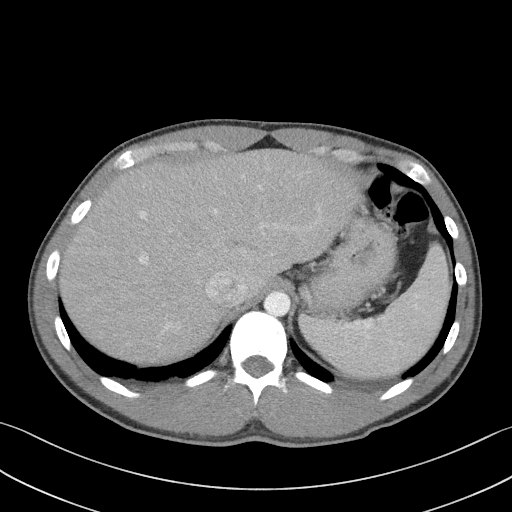
[im 87/92  soft-tissue]
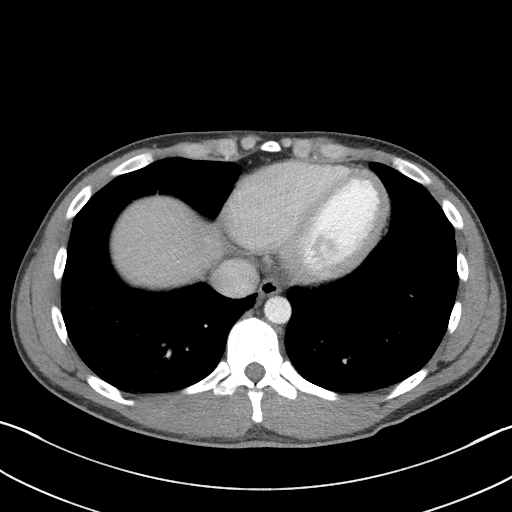

[Series 5: coronal st · coronal · 0.68mm/px · 3 of 77 slices shown]
[im 26/77  soft-tissue]
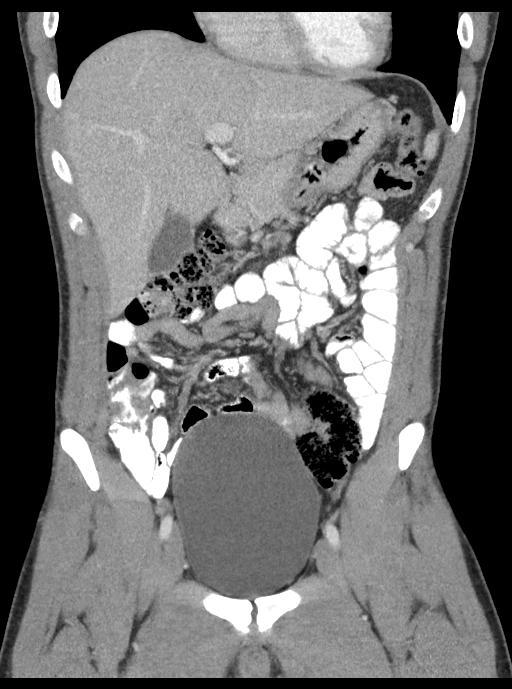
[im 34/77  soft-tissue]
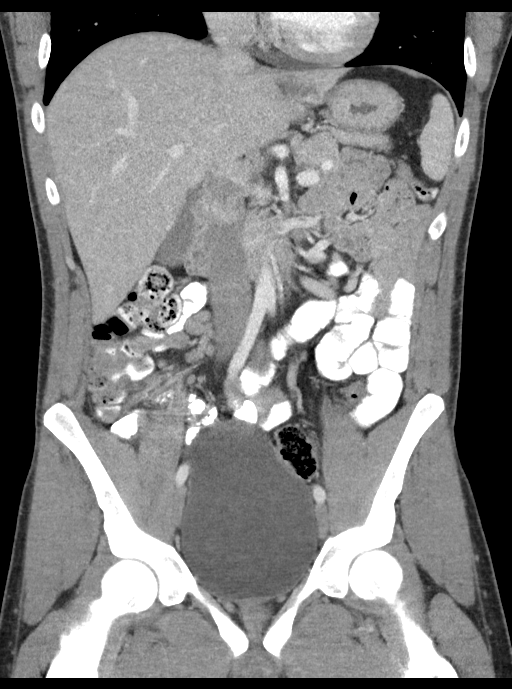
[im 43/77  soft-tissue]
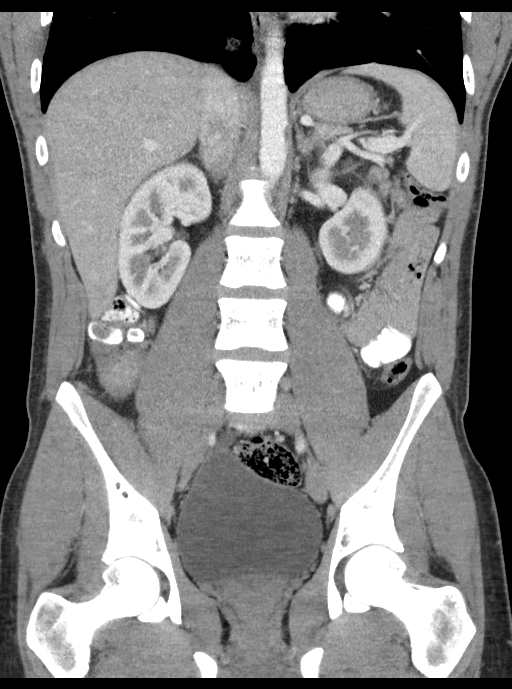

[16 of 46 positions shown; findings below may reference images not displayed]

FINDINGS: Lower chest: No acute abnormality.

Hepatobiliary: No focal liver abnormality is seen. No gallstones,
gallbladder wall thickening, or biliary dilatation.

Pancreas: Unremarkable. No pancreatic ductal dilatation or
surrounding inflammatory changes.

Spleen: Normal in size without focal abnormality.

Adrenals/Urinary Tract: Adrenal glands are unremarkable. Kidneys are
normal, without renal calculi, focal lesion, or hydronephrosis.
Bladder is unremarkable.

Stomach/Bowel: The stomach appears normal. There is no evidence of
bowel obstruction. There is continued presence of retrocecal
inflammatory abnormality measuring approximately 3.6 cm associated
with the tail of the appendix, most consistent with phlegmon
secondary to appendicitis. The more proximal portion of the appendix
is unremarkable.

Vascular/Lymphatic: No significant vascular findings are present.
Stable mesenteric lymph nodes are noted in right lower quadrant..

Reproductive: Prostate is unremarkable.

Other: No abdominal wall hernia or abnormality. No abdominopelvic
ascites.

Musculoskeletal: No acute or significant osseous findings.
IMPRESSION: Stable mesenteric lymph node seen in right lower quadrant. Stable
probable periappendiceal phlegmon or abscess is seen in right lower
quadrant compared to prior exam.

## 2019-05-19 ENCOUNTER — Other Ambulatory Visit: Payer: Self-pay

## 2019-05-19 DIAGNOSIS — Z20822 Contact with and (suspected) exposure to covid-19: Secondary | ICD-10-CM

## 2019-05-21 LAB — NOVEL CORONAVIRUS, NAA: SARS-CoV-2, NAA: DETECTED — AB

## 2019-05-23 ENCOUNTER — Telehealth: Payer: Self-pay | Admitting: *Deleted

## 2019-05-23 NOTE — Telephone Encounter (Signed)
See lab result note.   Didn't mean to open this section of the chart.
